# Patient Record
Sex: Female | Born: 1985
Health system: Southern US, Community
[De-identification: ages and names within clinical notes are randomized; demographics above are authoritative.]

## PROBLEM LIST (undated history)

## (undated) DIAGNOSIS — N840 Polyp of corpus uteri: Secondary | ICD-10-CM

## (undated) DIAGNOSIS — G43909 Migraine, unspecified, not intractable, without status migrainosus: Secondary | ICD-10-CM

## (undated) HISTORY — PX: WISDOM TOOTH EXTRACTION: SHX21

---

## 2017-12-01 ENCOUNTER — Other Ambulatory Visit (HOSPITAL_COMMUNITY)
Admission: RE | Admit: 2017-12-01 | Discharge: 2017-12-01 | Disposition: A | Discharge status: Home / Self Care | Payer: BLUE CROSS/BLUE SHIELD | Source: Ambulatory Visit | Attending: Obstetrics and Gynecology | Admitting: Obstetrics and Gynecology

## 2017-12-01 ENCOUNTER — Other Ambulatory Visit: Payer: Self-pay | Admitting: Obstetrics and Gynecology

## 2017-12-01 DIAGNOSIS — Z01411 Encounter for gynecological examination (general) (routine) with abnormal findings: Secondary | ICD-10-CM | POA: Diagnosis not present

## 2017-12-01 DIAGNOSIS — N760 Acute vaginitis: Secondary | ICD-10-CM | POA: Diagnosis not present

## 2017-12-01 DIAGNOSIS — N923 Ovulation bleeding: Secondary | ICD-10-CM | POA: Diagnosis not present

## 2017-12-05 LAB — CYTOLOGY - PAP
Diagnosis: NEGATIVE
HPV: NOT DETECTED

## 2017-12-06 DIAGNOSIS — Z13220 Encounter for screening for lipoid disorders: Secondary | ICD-10-CM | POA: Diagnosis not present

## 2017-12-06 DIAGNOSIS — Z131 Encounter for screening for diabetes mellitus: Secondary | ICD-10-CM | POA: Diagnosis not present

## 2017-12-27 DIAGNOSIS — R7309 Other abnormal glucose: Secondary | ICD-10-CM | POA: Diagnosis not present

## 2017-12-27 DIAGNOSIS — N923 Ovulation bleeding: Secondary | ICD-10-CM | POA: Diagnosis not present

## 2018-01-22 DIAGNOSIS — N923 Ovulation bleeding: Secondary | ICD-10-CM | POA: Diagnosis not present

## 2018-01-22 DIAGNOSIS — N9489 Other specified conditions associated with female genital organs and menstrual cycle: Secondary | ICD-10-CM | POA: Diagnosis not present

## 2018-02-28 ENCOUNTER — Ambulatory Visit (HOSPITAL_BASED_OUTPATIENT_CLINIC_OR_DEPARTMENT_OTHER)
Admission: RE | Admit: 2018-02-28 | Payer: BLUE CROSS/BLUE SHIELD | Source: Ambulatory Visit | Admitting: Obstetrics and Gynecology

## 2018-02-28 ENCOUNTER — Encounter (HOSPITAL_BASED_OUTPATIENT_CLINIC_OR_DEPARTMENT_OTHER): Admission: RE | Payer: Self-pay | Source: Ambulatory Visit

## 2018-02-28 SURGERY — DILATATION AND CURETTAGE /HYSTEROSCOPY
Anesthesia: Choice

## 2018-04-24 DIAGNOSIS — M62838 Other muscle spasm: Secondary | ICD-10-CM | POA: Diagnosis not present

## 2018-04-25 DIAGNOSIS — M542 Cervicalgia: Secondary | ICD-10-CM | POA: Diagnosis not present

## 2018-04-26 DIAGNOSIS — M542 Cervicalgia: Secondary | ICD-10-CM | POA: Diagnosis not present

## 2018-04-27 DIAGNOSIS — M542 Cervicalgia: Secondary | ICD-10-CM | POA: Diagnosis not present

## 2018-04-28 ENCOUNTER — Emergency Department (HOSPITAL_BASED_OUTPATIENT_CLINIC_OR_DEPARTMENT_OTHER)
Admission: EM | Admit: 2018-04-28 | Discharge: 2018-04-28 | Disposition: A | Discharge status: Home / Self Care | Payer: BLUE CROSS/BLUE SHIELD | Attending: Emergency Medicine | Admitting: Emergency Medicine

## 2018-04-28 ENCOUNTER — Other Ambulatory Visit: Payer: Self-pay

## 2018-04-28 ENCOUNTER — Encounter (HOSPITAL_BASED_OUTPATIENT_CLINIC_OR_DEPARTMENT_OTHER): Payer: Self-pay | Admitting: Emergency Medicine

## 2018-04-28 ENCOUNTER — Emergency Department (HOSPITAL_BASED_OUTPATIENT_CLINIC_OR_DEPARTMENT_OTHER): Payer: BLUE CROSS/BLUE SHIELD

## 2018-04-28 DIAGNOSIS — J029 Acute pharyngitis, unspecified: Secondary | ICD-10-CM | POA: Diagnosis not present

## 2018-04-28 DIAGNOSIS — M436 Torticollis: Secondary | ICD-10-CM | POA: Insufficient documentation

## 2018-04-28 DIAGNOSIS — M542 Cervicalgia: Secondary | ICD-10-CM | POA: Diagnosis not present

## 2018-04-28 LAB — CBC WITH DIFFERENTIAL/PLATELET
Basophils Absolute: 0 10*3/uL (ref 0.0–0.1)
Basophils Relative: 0 %
Eosinophils Absolute: 0.1 10*3/uL (ref 0.0–0.7)
Eosinophils Relative: 1 %
HCT: 37.9 % (ref 36.0–46.0)
Hemoglobin: 12.6 g/dL (ref 12.0–15.0)
Lymphocytes Relative: 30 %
Lymphs Abs: 2.2 10*3/uL (ref 0.7–4.0)
MCH: 29.6 pg (ref 26.0–34.0)
MCHC: 33.2 g/dL (ref 30.0–36.0)
MCV: 89 fL (ref 78.0–100.0)
Monocytes Absolute: 0.6 10*3/uL (ref 0.1–1.0)
Monocytes Relative: 8 %
Neutro Abs: 4.4 10*3/uL (ref 1.7–7.7)
Neutrophils Relative %: 61 %
Platelets: 226 10*3/uL (ref 150–400)
RBC: 4.26 MIL/uL (ref 3.87–5.11)
RDW: 12.3 % (ref 11.5–15.5)
WBC: 7.2 10*3/uL (ref 4.0–10.5)

## 2018-04-28 LAB — BASIC METABOLIC PANEL
Anion gap: 8 (ref 5–15)
BUN: 9 mg/dL (ref 6–20)
CO2: 25 mmol/L (ref 22–32)
Calcium: 9 mg/dL (ref 8.9–10.3)
Chloride: 104 mmol/L (ref 98–111)
Creatinine, Ser: 0.51 mg/dL (ref 0.44–1.00)
GFR calc Af Amer: >60 mL/min (ref >60)
GFR calc non Af Amer: >60 mL/min (ref >60)
Glucose, Bld: 88 mg/dL (ref 70–99)
Potassium: 4 mmol/L (ref 3.5–5.1)
Sodium: 137 mmol/L (ref 135–145)

## 2018-04-28 LAB — HCG, SERUM, QUALITATIVE: Preg, Serum: NEGATIVE

## 2018-04-28 MED ORDER — DIAZEPAM 5 MG PO TABS
5.0000 mg | ORAL_TABLET | Freq: Once | ORAL | Status: AC
  Administered 2018-04-28: 5 mg via ORAL
  Filled 2018-04-28: qty 1

## 2018-04-28 MED ORDER — KETOROLAC TROMETHAMINE 60 MG/2ML IM SOLN
60.0000 mg | Freq: Once | INTRAMUSCULAR | Status: AC
  Administered 2018-04-28: 60 mg via INTRAMUSCULAR
  Filled 2018-04-28: qty 2

## 2018-04-28 MED ORDER — IOPAMIDOL (ISOVUE-370) INJECTION 76%
100.0000 mL | Freq: Once | INTRAVENOUS | Status: AC | PRN
  Administered 2018-04-28: 100 mL via INTRAVENOUS

## 2018-04-28 MED ORDER — METHOCARBAMOL 500 MG PO TABS: 1000.0000 mg | ORAL_TABLET | Freq: Three times a day (TID) | ORAL | 0 refills | Status: AC | PRN

## 2018-04-28 MED ORDER — METHOCARBAMOL 500 MG PO TABS
1000.0000 mg | ORAL_TABLET | Freq: Once | ORAL | Status: AC
  Administered 2018-04-28: 1000 mg via ORAL
  Filled 2018-04-28: qty 2

## 2018-04-28 MED ORDER — DIAZEPAM 5 MG PO TABS: 5.0000 mg | ORAL_TABLET | Freq: Two times a day (BID) | ORAL | 0 refills | Status: DC | PRN

## 2018-04-28 MED ORDER — IBUPROFEN 600 MG PO TABS: 600.0000 mg | ORAL_TABLET | Freq: Four times a day (QID) | ORAL | 0 refills | Status: DC | PRN

## 2018-04-28 NOTE — ED Notes (Signed)
CT waiting for labs and IV prior to imaging with IV contrast

## 2018-04-28 NOTE — ED Provider Notes (Signed)
[facility] EMERGENCY DEPARTMENT Provider Note   CSN: [id number] Arrival date & time: 04/28/18  0803     History   Chief Complaint Chief Complaint  Patient presents with  . Neck Pain    HPI Michelle Sutton is a 32 y.o. female.  HPI Patient states she is been having neck pain for the past 5 days.  She is been seen by the chiropractor 3 times this week, last time was Thursday.  States that her symptoms were improving until she woke up this morning.  She complains of left-sided neck pain and difficulty turning her head.  She denies any chest pain.  No headache or visual changes.  No focal weakness or numbness.  Denies recent fever or chills.  She has had a scratchy, sore throat.  Has been taking naproxen at home with little relief. History reviewed. No pertinent past medical history.  There are no active problems to display for this patient.   Past Surgical History:  Procedure Laterality Date  . CESAREAN SECTION       OB History   None      Home Medications    Prior to Admission medications   Medication Sig Start Date End Date Taking? Authorizing Provider  diazepam (VALIUM) 5 MG tablet Take 1 tablet (5 mg total) by mouth every 12 (twelve) hours as needed for muscle spasms. 04/28/18   Loren RacerYelverton, Rossy Virag, MD  ibuprofen (ADVIL,MOTRIN) 600 MG tablet Take 1 tablet (600 mg total) by mouth every 6 (six) hours as needed. 04/28/18   Loren RacerYelverton, Kaely Hollan, MD  methocarbamol (ROBAXIN) 500 MG tablet Take 2 tablets (1,000 mg total) by mouth every 8 (eight) hours as needed for muscle spasms. 04/28/18   Loren RacerYelverton, Phelix Fudala, MD    Family History No family history on file.  Social History Social History   Tobacco Use  . Smoking status: Never Smoker  . Smokeless tobacco: Never Used  Substance Use Topics  . Alcohol use: Yes  . Drug use: Never     Allergies   Sulfa antibiotics   Review of Systems Review of Systems  Constitutional: Negative for chills and fever.  Eyes: Negative  for visual disturbance.  Respiratory: Negative for cough and shortness of breath.   Cardiovascular: Negative for chest pain.  Gastrointestinal: Negative for abdominal pain, diarrhea, nausea and vomiting.  Musculoskeletal: Positive for myalgias, neck pain and neck stiffness. Negative for back pain.  Skin: Negative for rash and wound.  Neurological: Negative for dizziness, weakness, light-headedness, numbness and headaches.  All other systems reviewed and are negative.    Physical Exam Updated Vital Signs BP 114/68 (BP Location: Right Arm)   Pulse 68   Temp 98.2 F (36.8 C) (Oral)   Resp 18   Ht 5' 2.5" (1.588 m)   Wt 59 kg   LMP 04/10/2018   SpO2 100%   BMI 23.40 kg/m   Physical Exam  Constitutional: She is oriented to person, place, and time. She appears well-developed and well-nourished. No distress.  HENT:  Head: Normocephalic and atraumatic.  Oropharynx is mildly erythematous.  Uvula is midline.  No tonsillar exudates.  Eyes: Pupils are equal, round, and reactive to light. EOM are normal.  Neck:  Tense musculature to the left lateral side of the neck and left trapezius.  No definite midline posterior cervical tenderness to palpation.  Carotid pulses intact.  No bruits appreciated.  Cardiovascular: Normal rate and regular rhythm. Exam reveals no gallop and no friction rub.  No murmur heard. Pulmonary/Chest:  Effort normal and breath sounds normal. No stridor. No respiratory distress. She has no wheezes. She has no rales. She exhibits no tenderness.  Abdominal: Soft. Bowel sounds are normal. There is no tenderness. There is no rebound and no guarding.  Musculoskeletal: Normal range of motion. She exhibits no edema or tenderness.  No midline thoracic or lumbar tenderness.  No lower extremity swelling, asymmetry or tenderness.  Distal pulses are 2+  Lymphadenopathy:    She has no cervical adenopathy.  Neurological: She is alert and oriented to person, place, and time.  Moving  all extremities without focal deficit.  Sensation intact.  Skin: Skin is warm and dry. No rash noted. She is not diaphoretic. No erythema.  Psychiatric: She has a normal mood and affect. Her behavior is normal.  Nursing note and vitals reviewed.    ED Treatments / Results  Labs (all labs ordered are listed, but only abnormal results are displayed) Labs Reviewed  BASIC METABOLIC PANEL  CBC WITH DIFFERENTIAL/PLATELET  HCG, SERUM, QUALITATIVE    EKG None  Radiology Dg Cervical Spine Complete  Result Date: 04/28/2018 CLINICAL DATA:  Neck pain 6 days.  No injury. EXAM: CERVICAL SPINE - COMPLETE 4+ VIEW COMPARISON:  None. FINDINGS: Vertebral body alignment, heights and disc space heights are normal. Prevertebral soft tissues are normal. The atlantoaxial articulation is normal. No evidence of right-sided neural foraminal narrowing. Mild left-sided neural foraminal narrowing at the C4-5 level due to adjacent bony spurring. IMPRESSION: No acute findings. Minimal left-sided neural foraminal narrowing at the C4-5 level due to adjacent bony spurring. Electronically Signed   By: Elberta Fortisaniel  Boyle M.D.   On: 04/28/2018 09:33   Ct Angio Neck W And/or Wo Contrast  Result Date: 04/28/2018 CLINICAL DATA:  Neck pain. Chiropractor manipulation this week. EXAM: CT ANGIOGRAPHY NECK TECHNIQUE: Multidetector CT imaging of the neck was performed using the standard protocol during bolus administration of intravenous contrast. Multiplanar CT image reconstructions and MIPs were obtained to evaluate the vascular anatomy. Carotid stenosis measurements (when applicable) are obtained utilizing NASCET criteria, using the distal internal carotid diameter as the denominator. CONTRAST:  100mL ISOVUE-370 IOPAMIDOL (ISOVUE-370) INJECTION 76% COMPARISON:  Cervical radiographs 04/28/2018 FINDINGS: Aortic arch: Standard branching. Imaged portion shows no evidence of aneurysm or dissection. No significant stenosis of the major arch  vessel origins. Right carotid system: Normal right carotid system Left carotid system: Normal left carotid system Vertebral arteries: Normal vertebral arteries bilaterally. Skeleton: Normal cervical spine.  No skeletal abnormality. Other neck: No soft tissue swelling or mass. Upper chest: Normal lung apices. IMPRESSION: Normal CTA neck. Negative for dissection or stenosis. No bony abnormality. Electronically Signed   By: Marlan Palauharles  Clark M.D.   On: 04/28/2018 12:40    Procedures Procedures (including critical care time)  Medications Ordered in ED Medications  ketorolac (TORADOL) injection 60 mg (60 mg Intramuscular Given 04/28/18 0901)  methocarbamol (ROBAXIN) tablet 1,000 mg (1,000 mg Oral Given 04/28/18 0900)  diazepam (VALIUM) tablet 5 mg (5 mg Oral Given 04/28/18 0900)  iopamidol (ISOVUE-370) 76 % injection 100 mL (100 mLs Intravenous Contrast Given 04/28/18 1219)     Initial Impression / Assessment and Plan / ED Course  I have reviewed the triage vital signs and the nursing notes.  Pertinent labs & imaging results that were available during my care of the patient were reviewed by me and considered in my medical decision making (see chart for details).     Improved movement and pain with medication.  Given history of  multiple adjustments CT angio neck obtained.  No evidence of vascular injury.  Laboratory work-up is normal.  Normal white blood cell count.  Low suspicion for infectious cause.  Will treat symptomatically for torticollis.  Have advised using heating pad.  Return precautions given.  Final Clinical Impressions(s) / ED Diagnoses   Final diagnoses:  Torticollis, acute    ED Discharge Orders         Ordered    diazepam (VALIUM) 5 MG tablet  Every 12 hours PRN     04/28/18 1307    ibuprofen (ADVIL,MOTRIN) 600 MG tablet  Every 6 hours PRN     04/28/18 1307    methocarbamol (ROBAXIN) 500 MG tablet  Every 8 hours PRN     04/28/18 1307           Loren Racer,  MD 04/28/18 1310

## 2018-04-28 NOTE — ED Notes (Signed)
Patient transported to X-ray 

## 2018-04-28 NOTE — ED Triage Notes (Signed)
Neck pain since Monday. Denies injury. Was seen by chiropractor several times this week. States it was getting better but the pain worsened again yesterday.

## 2018-05-08 DIAGNOSIS — D259 Leiomyoma of uterus, unspecified: Secondary | ICD-10-CM | POA: Diagnosis not present

## 2018-05-08 DIAGNOSIS — N921 Excessive and frequent menstruation with irregular cycle: Secondary | ICD-10-CM | POA: Diagnosis not present

## 2018-05-21 ENCOUNTER — Other Ambulatory Visit: Payer: Self-pay | Admitting: Obstetrics & Gynecology

## 2018-05-23 ENCOUNTER — Other Ambulatory Visit: Payer: Self-pay

## 2018-05-23 ENCOUNTER — Encounter (HOSPITAL_BASED_OUTPATIENT_CLINIC_OR_DEPARTMENT_OTHER): Payer: Self-pay

## 2018-05-23 NOTE — Progress Notes (Signed)
Spoke with:  Michelle Sutton NPO:   No food after midnight/Clear liquids until 7:00AM DOS Arrival time: 1145AM Labs: UPT AM medications: None Pre op orders: Yes Ride home:  Barkley Boards (husband) [phone]

## 2018-05-30 NOTE — H&P (Signed)
Michelle Sutton is an 32 y.o. female with intermenstrual bleeding and dysmenorrhea.  She was found to have a 1.5 cm submucosal endometrial lesion, likely a fibroid and is here for D & C hysteroscopy with Myosure.    Pertinent Gynecological History: Menses: regular every 28 days without intermenstrual spotting and with severe dysmenorrhea Bleeding: intermenstrual bleeding Contraception: none DES exposure: unknown Blood transfusions: none Sexually transmitted diseases: no past history Previous GYN Procedures: None  Last mammogram: None Date: N/A Last pap: normal Date: 12/01/17 OB History: G1, P1001   Menstrual History: Patient's last menstrual period was 05/06/2018 (exact date).    Past Medical History:  Diagnosis Date  . Migraines     Past Surgical History:  Procedure Laterality Date  . CESAREAN SECTION    . WISDOM TOOTH EXTRACTION      History reviewed. No pertinent family history.  Social History:  reports that she has never smoked. She has never used smokeless tobacco. She reports that she drinks alcohol. She reports that she does not use drugs.  Allergies:  Allergies  Allergen Reactions  . Sulfa Antibiotics   . Eggs Or Egg-Derived Products Rash    No medications prior to admission.    ROS Constitutional: Denies fevers/chills Cardiovascular: Denies chest pain or palpitations Pulmonary: Denies coughing or wheezing Gastrointestinal: Denies nausea, vomiting or diarrhea Genitourinary: Denies unusual vaginal discharge, dysuria, urgency or frequency.  Musculoskeletal: Denies muscle or joint aches and pain.  Neurology: Denies abnormal sensations such as tingling or numbness.   Height 5\' 3"  (1.6 m), weight 59 kg, last menstrual period 05/06/2018. Physical Exam Constitutional: She is oriented to person, place, and time. She appears well-developed and well-nourished.  HENT:  Head: Normocephalic and atraumatic.  Neck: Normal range of motion.  Cardiovascular: Normal rate,  regular rhythm and normal heart sounds.   Respiratory: Effort normal and breath sounds normal.  GI: Soft. Bowel sounds are normal.  Neurological: She is alert and oriented to person, place, and time.  Skin: Skin is warm and dry.  Psychiatric: She has a normal mood and affect. Her behavior is normal.  No results found for this or any previous visit (from the past 24 hour(s)).  No results found. CBC    Component Value Date/Time   WBC 7.2 04/28/2018 1129   RBC 4.26 04/28/2018 1129   HGB 12.6 04/28/2018 1129   HCT 37.9 04/28/2018 1129   PLT 226 04/28/2018 1129   MCV 89.0 04/28/2018 1129   MCH 29.6 04/28/2018 1129   MCHC 33.2 04/28/2018 1129   RDW 12.3 04/28/2018 1129   LYMPHSABS 2.2 04/28/2018 1129   MONOABS 0.6 04/28/2018 1129   EOSABS 0.1 04/28/2018 1129   BASOSABS 0.0 04/28/2018 1129    05/31/18: Pregnancy, urine POC  Order: [id number]  Status:  Final result Visible to patient:  No (Not Released) Next appt:  None   Ref Range & Units 12:20  Preg Test, Ur NEGATIVE NEGATIVE   Comment:     THE SENSITIVITY OF THIS  METHODOLOGY IS >24 mIU/mL        Pelvic Ultrasound 01/22/18: Uterus 9 x 6x 4cm with a fundal mass 1.4 cm x 1.3cm x 1cm, confirmed on saline sonohysterogram.  Normal ovaries bilaterally.    Assessment/Plan: 32 y/o P1 with metrorrhagia and dysmenorrhea with a 1.5 cm intaruterine lesion likely a myoma, here for a  D & C hysteroscopy with myosure -This procedure has been fully reviewed with the patient and written informed consent has been obtained. -Admit to day  surgery -NPO -IV fluids Michelle Sutton,Michelle Lorenzi WAKURU, MD.  05/30/2018, 3:22 PM

## 2018-05-31 ENCOUNTER — Ambulatory Visit (HOSPITAL_BASED_OUTPATIENT_CLINIC_OR_DEPARTMENT_OTHER): Payer: BLUE CROSS/BLUE SHIELD | Admitting: Anesthesiology

## 2018-05-31 ENCOUNTER — Other Ambulatory Visit: Payer: Self-pay

## 2018-05-31 ENCOUNTER — Encounter (HOSPITAL_BASED_OUTPATIENT_CLINIC_OR_DEPARTMENT_OTHER)
Admission: RE | Disposition: A | Discharge status: Home / Self Care | Payer: Self-pay | Source: Ambulatory Visit | Attending: Obstetrics & Gynecology

## 2018-05-31 ENCOUNTER — Ambulatory Visit (HOSPITAL_BASED_OUTPATIENT_CLINIC_OR_DEPARTMENT_OTHER)
Admission: RE | Admit: 2018-05-31 | Discharge: 2018-05-31 | Disposition: A | Discharge status: Home / Self Care | Payer: BLUE CROSS/BLUE SHIELD | Source: Ambulatory Visit | Attending: Obstetrics & Gynecology | Admitting: Obstetrics & Gynecology

## 2018-05-31 ENCOUNTER — Encounter (HOSPITAL_BASED_OUTPATIENT_CLINIC_OR_DEPARTMENT_OTHER): Payer: Self-pay | Admitting: Anesthesiology

## 2018-05-31 DIAGNOSIS — Z91012 Allergy to eggs: Secondary | ICD-10-CM | POA: Diagnosis not present

## 2018-05-31 DIAGNOSIS — Z882 Allergy status to sulfonamides status: Secondary | ICD-10-CM | POA: Insufficient documentation

## 2018-05-31 DIAGNOSIS — N841 Polyp of cervix uteri: Secondary | ICD-10-CM | POA: Diagnosis not present

## 2018-05-31 DIAGNOSIS — N946 Dysmenorrhea, unspecified: Secondary | ICD-10-CM | POA: Insufficient documentation

## 2018-05-31 DIAGNOSIS — N921 Excessive and frequent menstruation with irregular cycle: Secondary | ICD-10-CM | POA: Diagnosis not present

## 2018-05-31 DIAGNOSIS — D259 Leiomyoma of uterus, unspecified: Secondary | ICD-10-CM | POA: Diagnosis not present

## 2018-05-31 HISTORY — PX: DILATATION & CURETTAGE/HYSTEROSCOPY WITH MYOSURE: SHX6511

## 2018-05-31 HISTORY — DX: Migraine, unspecified, not intractable, without status migrainosus: G43.909

## 2018-05-31 LAB — POCT PREGNANCY, URINE: Preg Test, Ur: NEGATIVE

## 2018-05-31 SURGERY — DILATATION & CURETTAGE/HYSTEROSCOPY WITH MYOSURE
Anesthesia: Monitor Anesthesia Care | Site: Vagina

## 2018-05-31 MED ORDER — ONDANSETRON HCL 4 MG/2ML IJ SOLN
INTRAMUSCULAR | Status: DC | PRN
  Administered 2018-05-31: 4 mg via INTRAVENOUS

## 2018-05-31 MED ORDER — SODIUM CHLORIDE 0.9 % IR SOLN
Status: DC | PRN
  Administered 2018-05-31: 3000 mL

## 2018-05-31 MED ORDER — METOCLOPRAMIDE HCL 5 MG/ML IJ SOLN
10.0000 mg | Freq: Once | INTRAMUSCULAR | Status: DC | PRN
  Filled 2018-05-31: qty 2

## 2018-05-31 MED ORDER — FENTANYL CITRATE (PF) 100 MCG/2ML IJ SOLN
INTRAMUSCULAR | Status: DC | PRN
  Administered 2018-05-31 (×2): 25 ug via INTRAVENOUS

## 2018-05-31 MED ORDER — KETOROLAC TROMETHAMINE 30 MG/ML IJ SOLN
INTRAMUSCULAR | Status: AC
  Filled 2018-05-31: qty 1

## 2018-05-31 MED ORDER — PROPOFOL 10 MG/ML IV BOLUS
INTRAVENOUS | Status: AC
  Filled 2018-05-31: qty 40

## 2018-05-31 MED ORDER — LIDOCAINE 2% (20 MG/ML) 5 ML SYRINGE
INTRAMUSCULAR | Status: AC
  Filled 2018-05-31: qty 5

## 2018-05-31 MED ORDER — SCOPOLAMINE 1 MG/3DAYS TD PT72
MEDICATED_PATCH | TRANSDERMAL | Status: AC
  Filled 2018-05-31: qty 1

## 2018-05-31 MED ORDER — SILVER NITRATE-POT NITRATE 75-25 % EX MISC
CUTANEOUS | Status: DC | PRN
  Administered 2018-05-31: 1

## 2018-05-31 MED ORDER — FENTANYL CITRATE (PF) 100 MCG/2ML IJ SOLN
INTRAMUSCULAR | Status: AC
  Filled 2018-05-31: qty 2

## 2018-05-31 MED ORDER — MEPERIDINE HCL 25 MG/ML IJ SOLN
6.2500 mg | INTRAMUSCULAR | Status: DC | PRN
  Filled 2018-05-31: qty 1

## 2018-05-31 MED ORDER — HYDROMORPHONE HCL 1 MG/ML IJ SOLN
0.2500 mg | INTRAMUSCULAR | Status: DC | PRN
  Filled 2018-05-31: qty 0.5

## 2018-05-31 MED ORDER — SCOPOLAMINE 1 MG/3DAYS TD PT72
1.0000 | MEDICATED_PATCH | TRANSDERMAL | Status: DC
  Administered 2018-05-31: 1.5 mg via TRANSDERMAL
  Filled 2018-05-31: qty 1

## 2018-05-31 MED ORDER — MIDAZOLAM HCL 2 MG/2ML IJ SOLN
INTRAMUSCULAR | Status: AC
  Filled 2018-05-31: qty 2

## 2018-05-31 MED ORDER — HYDROCODONE-ACETAMINOPHEN 7.5-325 MG PO TABS
1.0000 | ORAL_TABLET | Freq: Once | ORAL | Status: DC | PRN
  Filled 2018-05-31: qty 1

## 2018-05-31 MED ORDER — PROPOFOL 500 MG/50ML IV EMUL
INTRAVENOUS | Status: DC | PRN
  Administered 2018-05-31: 200 ug/kg/min via INTRAVENOUS

## 2018-05-31 MED ORDER — DEXAMETHASONE SODIUM PHOSPHATE 10 MG/ML IJ SOLN
INTRAMUSCULAR | Status: AC
  Filled 2018-05-31: qty 1

## 2018-05-31 MED ORDER — IBUPROFEN 600 MG PO TABS: 600.0000 mg | ORAL_TABLET | Freq: Four times a day (QID) | ORAL | 0 refills | Status: AC | PRN

## 2018-05-31 MED ORDER — BUPIVACAINE-EPINEPHRINE 0.5% -1:200000 IJ SOLN
INTRAMUSCULAR | Status: DC | PRN
  Administered 2018-05-31: 10 mL

## 2018-05-31 MED ORDER — KETOROLAC TROMETHAMINE 30 MG/ML IJ SOLN
INTRAMUSCULAR | Status: DC | PRN
  Administered 2018-05-31: 30 mg via INTRAVENOUS

## 2018-05-31 MED ORDER — LIDOCAINE HCL (CARDIAC) PF 100 MG/5ML IV SOSY
PREFILLED_SYRINGE | INTRAVENOUS | Status: DC | PRN
  Administered 2018-05-31: 50 mg via INTRAVENOUS

## 2018-05-31 MED ORDER — LACTATED RINGERS IV SOLN
INTRAVENOUS | Status: DC
  Administered 2018-05-31 (×2): via INTRAVENOUS
  Filled 2018-05-31: qty 1000

## 2018-05-31 MED ORDER — MIDAZOLAM HCL 5 MG/5ML IJ SOLN
INTRAMUSCULAR | Status: DC | PRN
  Administered 2018-05-31 (×2): 1 mg via INTRAVENOUS

## 2018-05-31 MED ORDER — PROPOFOL 10 MG/ML IV BOLUS
INTRAVENOUS | Status: AC
  Filled 2018-05-31: qty 20

## 2018-05-31 MED ORDER — ONDANSETRON HCL 4 MG/2ML IJ SOLN
INTRAMUSCULAR | Status: AC
  Filled 2018-05-31: qty 2

## 2018-05-31 MED ORDER — DEXAMETHASONE SODIUM PHOSPHATE 4 MG/ML IJ SOLN
INTRAMUSCULAR | Status: DC | PRN
  Administered 2018-05-31: 10 mg via INTRAVENOUS

## 2018-05-31 SURGICAL SUPPLY — 17 items
CANISTER SUCT 3000ML PPV (MISCELLANEOUS) ×2 IMPLANT
CATH ROBINSON RED A/P 16FR (CATHETERS) ×2 IMPLANT
DEVICE MYOSURE LITE (MISCELLANEOUS) ×2 IMPLANT
DEVICE MYOSURE REACH (MISCELLANEOUS) IMPLANT
GAUZE SPONGE 4X4 16PLY XRAY LF (GAUZE/BANDAGES/DRESSINGS) ×2 IMPLANT
GLOVE BIO SURGEON STRL SZ7 (GLOVE) ×2 IMPLANT
GLOVE BIOGEL PI IND STRL 7.0 (GLOVE) ×3 IMPLANT
GLOVE BIOGEL PI IND STRL 7.5 (GLOVE) ×3 IMPLANT
GLOVE BIOGEL PI INDICATOR 7.0 (GLOVE) ×3
GLOVE BIOGEL PI INDICATOR 7.5 (GLOVE) ×3
GLOVE SURG SS PI 6.5 STRL IVOR (GLOVE) ×2 IMPLANT
GOWN STRL REUS W/TWL LRG LVL3 (GOWN DISPOSABLE) ×6 IMPLANT
KIT PROCEDURE FLUENT (KITS) ×2 IMPLANT
PACK VAGINAL MINOR WOMEN LF (CUSTOM PROCEDURE TRAY) ×2 IMPLANT
PAD OB MATERNITY 4.3X12.25 (PERSONAL CARE ITEMS) ×2 IMPLANT
SEAL ROD LENS SCOPE MYOSURE (ABLATOR) ×2 IMPLANT
TOWEL OR 17X24 6PK STRL BLUE (TOWEL DISPOSABLE) ×4 IMPLANT

## 2018-05-31 NOTE — Op Note (Signed)
Michelle Sutton, [place] Female, [place] y.o., 1986/04/20 MRN: [id number]  PREOP DIAGNOSIS:  1.Metrorrhagia 2.Dymenorrhea 3.Endometrial lesion, suspect fibroid.    Post operative diagnosis: Same as above  Procedures: Dilation and Currettage hysteroscopy, Myosure.   Surgeon: Dr. Waymon Amato  Assistant: None  Anesthesia: MAC  Complications: None  IV fluid: 600 cc normal saline  Fluid deficit: 725 cc  Urine: 50 cc straight catheterization  EBL: 10 cc  Indications: 32 year old P1 with a history of metrorrhagia and dysmenorrhea and found to have an endometrial lesion, suspect a fibroid here for resection.        Procedure:  Informed consent was obtained from the patient to undergo the procedure including risks of bleeding, infection and  damage to organs.  She was taken to the operating room and anesthesia was administered without difficulty.  She was prepped and draped in the usual sterile fashion. She was straight catheterized.  An exam was then performed under anesthesia revealing a small anteverted uterus and a closed cervix. There were no palpable adnexal masses.A small cut at perineum was noted before the procedure was began.    A graves speculum was used to view the cervix. Single-tooth tenaculum was placed anteriorly then posteriorly on the cervix. The cervix was dilated to #19 pratt dilators but hysteroscope could not advance through the internal cervical os.  Further cervical dilation with Hegar dilators was performed up to number 7 dilator.  Myosure diagnostic scope was then placed to view the uterine cavity and uterus was noted to have thick fluffy endometrial tissue as well as small fibroid-like lesion on posterior uterine wall just infront of the right tubal ostia.  The thick endometrial tissue as well as the lesion were shaved off using Myosure LITE under direct visualization without any complications. She tolerated the procedure well. Both tubal ostia were visualized during the procedure.  No  other lesions were noted. The myosure was removed and sharp curretage performed to obtain endocervical and endometrial specimen  All instruments were then removed and the patient was awoken from anesthesia and taken to recovery room in stable condition  Specimen: Endometrial lesion suspect fibroids and shavings, endocervical currettings.   Disposition: Stable to PACU.   Dr. Waymon Amato.  05/31/2018 1501.

## 2018-05-31 NOTE — Discharge Instructions (Signed)
°  Post D & C Hysteroscopy Instructions Ms. Michelle Sutton, 1. Nothing in vagina x 2 weeks.  2. Expect some vaginal bleeding for next several days, call me if with excessive bleeding requiring you to change a pad every hour.  3.  Expect some abdominal cramping, take pain medication as needed.  Call me if pain is intolerable despite medication use.  Sincerely, Dr. Sallye OberKulwa.   Post Anesthesia Home Care Instructions  Activity: Get plenty of rest for the remainder of the day. A responsible adult should stay with you for 24 hours following the procedure.  For the next 24 hours, DO NOT: -Drive a car -Advertising copywriterperate machinery -Drink alcoholic beverages -Take any medication unless instructed by your physician -Make any legal decisions or sign important papers.  Meals: Start with liquid foods such as gelatin or soup. Progress to regular foods as tolerated. Avoid greasy, spicy, heavy foods. If nausea and/or vomiting occur, drink only clear liquids until the nausea and/or vomiting subsides. Call your physician if vomiting continues.  Special Instructions/Symptoms: Your throat may feel dry or sore from the anesthesia or the breathing tube placed in your throat during surgery. If this causes discomfort, gargle with warm salt water. The discomfort should disappear within 24 hours.  If you had a scopolamine patch placed behind your ear for the management of post- operative nausea and/or vomiting:  1. The medication in the patch is effective for 72 hours, after which it should be removed.  Wrap patch in a tissue and discard in the trash. Wash hands thoroughly with soap and water. 2. You may remove the patch earlier than 72 hours if you experience unpleasant side effects which may include dry mouth, dizziness or visual disturbances. 3. Avoid touching the patch. Wash your hands with soap and water after contact with the patch.

## 2018-05-31 NOTE — Interval H&P Note (Signed)
Patient is consented for a Dilation and currettage Hysteroscopy with Myosure after reviewing the risks, benefits and alternatives of the procedure.   Dr. Sallye OberKulwa.  05/31/2018 1351

## 2018-05-31 NOTE — Anesthesia Procedure Notes (Signed)
Procedure Name: MAC Date/Time: 05/31/2018 1:50 PM Performed by: Wanita Chamberlain, CRNA Pre-anesthesia Checklist: Patient being monitored, Suction available, Emergency Drugs available, Patient identified and Timeout performed Patient Re-evaluated:Patient Re-evaluated prior to induction Oxygen Delivery Method: Nasal cannula Induction Type: IV induction Placement Confirmation: CO2 detector,  positive ETCO2 and breath sounds checked- equal and bilateral Dental Injury: Teeth and Oropharynx as per pre-operative assessment

## 2018-05-31 NOTE — Anesthesia Preprocedure Evaluation (Addendum)
Anesthesia Evaluation  Patient identified by MRN, date of birth, ID band Patient awake    Reviewed: Allergy & Precautions, NPO status , Patient's Chart, lab work & pertinent test results  Airway Mallampati: II  TM Distance: >3 FB Neck ROM: Full    Dental no notable dental hx. (+) Teeth Intact, Dental Advisory Given   Pulmonary neg pulmonary ROS,    Pulmonary exam normal breath sounds clear to auscultation       Cardiovascular negative cardio ROS Normal cardiovascular exam Rhythm:Regular Rate:Normal     Neuro/Psych  Headaches, negative psych ROS   GI/Hepatic negative GI ROS, Neg liver ROS,   Endo/Other  negative endocrine ROS  Renal/GU negative Renal ROS  negative genitourinary   Musculoskeletal negative musculoskeletal ROS (+)   Abdominal (+) - obese,   Peds  Hematology negative hematology ROS (+)   Anesthesia Other Findings   Reproductive/Obstetrics Uterine fibroids Irregular menstrual bleeding                            Anesthesia Physical Anesthesia Plan  ASA: II  Anesthesia Plan: MAC   Post-op Pain Management:    Induction: Intravenous  PONV Risk Score and Plan: 4 or greater and Scopolamine patch - Pre-op, Midazolam, Dexamethasone, Ondansetron and Treatment may vary due to age or medical condition  Airway Management Planned: Natural Airway and Simple Face Mask  Additional Equipment:   Intra-op Plan:   Post-operative Plan:   Informed Consent: I have reviewed the patients History and Physical, chart, labs and discussed the procedure including the risks, benefits and alternatives for the proposed anesthesia with the patient or authorized representative who has indicated his/her understanding and acceptance.   Dental advisory given  Plan Discussed with: CRNA and Surgeon  Anesthesia Plan Comments:        Anesthesia Quick Evaluation

## 2018-05-31 NOTE — Transfer of Care (Signed)
Immediate Anesthesia Transfer of Care Note  Patient: [facility]  Procedure(s) Performed: DILATATION & CURETTAGE/HYSTEROSCOPY WITH MYOSURE (N/A Vagina )  Patient Location: PACU  Anesthesia Type:MAC  Level of Consciousness: awake, alert , oriented and patient cooperative  Airway & Oxygen Therapy: Patient Spontanous Breathing and Patient connected to nasal cannula oxygen  Post-op Assessment: Report given to RN and Post -op Vital signs reviewed and stable  Post vital signs: Reviewed and stable  Last Vitals:  Vitals Value Taken Time  BP 116/69 05/31/2018  2:45 PM  Temp    Pulse 63 05/31/2018  2:46 PM  Resp 16 05/31/2018  2:46 PM  SpO2 100 % 05/31/2018  2:46 PM  Vitals shown include unvalidated device data.  Last Pain:  Vitals:   05/31/18 1231  TempSrc:   PainSc: 0-No pain      Patients Stated Pain Goal: 6 (12/75/17 0017)  Complications: No apparent anesthesia complications

## 2018-06-01 ENCOUNTER — Encounter (HOSPITAL_BASED_OUTPATIENT_CLINIC_OR_DEPARTMENT_OTHER): Payer: Self-pay | Admitting: Obstetrics & Gynecology

## 2018-06-04 NOTE — Anesthesia Postprocedure Evaluation (Addendum)
Anesthesia Post Note  Patient: Michelle Sutton  Procedure(s) Performed: DILATATION & CURETTAGE/HYSTEROSCOPY WITH MYOSURE (N/A Vagina )     Patient location during evaluation: PACU Anesthesia Type: MAC Level of consciousness: awake and alert Pain management: pain level controlled Vital Signs Assessment: post-procedure vital signs reviewed and stable Respiratory status: spontaneous breathing, nonlabored ventilation, respiratory function stable and patient connected to nasal cannula oxygen Cardiovascular status: blood pressure returned to baseline and stable Postop Assessment: no apparent nausea or vomiting Anesthetic complications: no    Last Vitals:  Vitals:   05/31/18 1545 05/31/18 1636  BP:  112/71  Pulse: 76 64  Resp:  18  Temp:  37.1 C  SpO2: 100%     Last Pain:  Vitals:   06/01/18 1325  TempSrc:   PainSc: 0-No pain   Pain Goal: Patients Stated Pain Goal: 6 (05/31/18 1231)               Lidia Collum

## 2018-06-14 DIAGNOSIS — N898 Other specified noninflammatory disorders of vagina: Secondary | ICD-10-CM | POA: Diagnosis not present

## 2018-06-28 DIAGNOSIS — Z0000 Encounter for general adult medical examination without abnormal findings: Secondary | ICD-10-CM | POA: Diagnosis not present

## 2018-07-10 DIAGNOSIS — M545 Low back pain: Secondary | ICD-10-CM | POA: Diagnosis not present

## 2018-10-04 DIAGNOSIS — R1013 Epigastric pain: Secondary | ICD-10-CM | POA: Diagnosis not present

## 2019-02-05 DIAGNOSIS — Z6824 Body mass index (BMI) 24.0-24.9, adult: Secondary | ICD-10-CM | POA: Diagnosis not present

## 2019-02-05 DIAGNOSIS — Z01419 Encounter for gynecological examination (general) (routine) without abnormal findings: Secondary | ICD-10-CM | POA: Diagnosis not present

## 2019-02-25 DIAGNOSIS — M5431 Sciatica, right side: Secondary | ICD-10-CM | POA: Diagnosis not present

## 2019-02-25 DIAGNOSIS — M545 Low back pain: Secondary | ICD-10-CM | POA: Diagnosis not present

## 2019-02-26 DIAGNOSIS — M542 Cervicalgia: Secondary | ICD-10-CM | POA: Diagnosis not present

## 2019-02-26 DIAGNOSIS — M546 Pain in thoracic spine: Secondary | ICD-10-CM | POA: Diagnosis not present

## 2019-02-26 DIAGNOSIS — M545 Low back pain: Secondary | ICD-10-CM | POA: Diagnosis not present

## 2019-02-28 DIAGNOSIS — M545 Low back pain: Secondary | ICD-10-CM | POA: Diagnosis not present

## 2019-03-05 DIAGNOSIS — M545 Low back pain: Secondary | ICD-10-CM | POA: Diagnosis not present

## 2019-03-07 DIAGNOSIS — M545 Low back pain: Secondary | ICD-10-CM | POA: Diagnosis not present

## 2019-03-19 DIAGNOSIS — M545 Low back pain: Secondary | ICD-10-CM | POA: Diagnosis not present

## 2019-03-19 DIAGNOSIS — M542 Cervicalgia: Secondary | ICD-10-CM | POA: Diagnosis not present

## 2019-03-26 DIAGNOSIS — M546 Pain in thoracic spine: Secondary | ICD-10-CM | POA: Diagnosis not present

## 2019-03-26 DIAGNOSIS — M545 Low back pain: Secondary | ICD-10-CM | POA: Diagnosis not present

## 2019-03-28 DIAGNOSIS — M542 Cervicalgia: Secondary | ICD-10-CM | POA: Diagnosis not present

## 2019-03-28 DIAGNOSIS — M545 Low back pain: Secondary | ICD-10-CM | POA: Diagnosis not present

## 2019-04-02 DIAGNOSIS — M546 Pain in thoracic spine: Secondary | ICD-10-CM | POA: Diagnosis not present

## 2019-04-02 DIAGNOSIS — M542 Cervicalgia: Secondary | ICD-10-CM | POA: Diagnosis not present

## 2019-04-16 DIAGNOSIS — M542 Cervicalgia: Secondary | ICD-10-CM | POA: Diagnosis not present

## 2019-04-16 DIAGNOSIS — M545 Low back pain: Secondary | ICD-10-CM | POA: Diagnosis not present

## 2019-05-07 DIAGNOSIS — M545 Low back pain: Secondary | ICD-10-CM | POA: Diagnosis not present

## 2019-05-07 DIAGNOSIS — M62838 Other muscle spasm: Secondary | ICD-10-CM | POA: Diagnosis not present

## 2019-05-29 DIAGNOSIS — R109 Unspecified abdominal pain: Secondary | ICD-10-CM | POA: Diagnosis not present

## 2019-05-29 DIAGNOSIS — N920 Excessive and frequent menstruation with regular cycle: Secondary | ICD-10-CM | POA: Diagnosis not present

## 2019-05-29 DIAGNOSIS — R102 Pelvic and perineal pain: Secondary | ICD-10-CM | POA: Diagnosis not present

## 2019-05-29 DIAGNOSIS — Z113 Encounter for screening for infections with a predominantly sexual mode of transmission: Secondary | ICD-10-CM | POA: Diagnosis not present

## 2019-06-06 DIAGNOSIS — M542 Cervicalgia: Secondary | ICD-10-CM | POA: Diagnosis not present

## 2019-06-06 DIAGNOSIS — M546 Pain in thoracic spine: Secondary | ICD-10-CM | POA: Diagnosis not present

## 2019-06-25 DIAGNOSIS — Z1159 Encounter for screening for other viral diseases: Secondary | ICD-10-CM | POA: Diagnosis not present

## 2019-07-01 DIAGNOSIS — B349 Viral infection, unspecified: Secondary | ICD-10-CM | POA: Diagnosis not present

## 2019-07-01 DIAGNOSIS — R05 Cough: Secondary | ICD-10-CM | POA: Diagnosis not present

## 2019-07-01 DIAGNOSIS — J3489 Other specified disorders of nose and nasal sinuses: Secondary | ICD-10-CM | POA: Diagnosis not present

## 2019-07-02 DIAGNOSIS — R05 Cough: Secondary | ICD-10-CM | POA: Diagnosis not present

## 2019-07-11 DIAGNOSIS — M545 Low back pain: Secondary | ICD-10-CM | POA: Diagnosis not present

## 2019-08-01 DIAGNOSIS — M545 Low back pain: Secondary | ICD-10-CM | POA: Diagnosis not present

## 2019-08-01 DIAGNOSIS — M546 Pain in thoracic spine: Secondary | ICD-10-CM | POA: Diagnosis not present

## 2019-08-29 DIAGNOSIS — M545 Low back pain: Secondary | ICD-10-CM | POA: Diagnosis not present

## 2019-09-26 DIAGNOSIS — M545 Low back pain: Secondary | ICD-10-CM | POA: Diagnosis not present

## 2019-09-26 DIAGNOSIS — M9903 Segmental and somatic dysfunction of lumbar region: Secondary | ICD-10-CM | POA: Diagnosis not present

## 2019-10-10 DIAGNOSIS — M545 Low back pain: Secondary | ICD-10-CM | POA: Diagnosis not present

## 2019-10-10 DIAGNOSIS — R519 Headache, unspecified: Secondary | ICD-10-CM | POA: Diagnosis not present

## 2019-10-24 DIAGNOSIS — M545 Low back pain: Secondary | ICD-10-CM | POA: Diagnosis not present

## 2019-10-24 DIAGNOSIS — M9903 Segmental and somatic dysfunction of lumbar region: Secondary | ICD-10-CM | POA: Diagnosis not present

## 2019-11-07 DIAGNOSIS — M545 Low back pain: Secondary | ICD-10-CM | POA: Diagnosis not present

## 2019-11-07 DIAGNOSIS — M9903 Segmental and somatic dysfunction of lumbar region: Secondary | ICD-10-CM | POA: Diagnosis not present

## 2019-11-21 DIAGNOSIS — M545 Low back pain: Secondary | ICD-10-CM | POA: Diagnosis not present

## 2019-11-21 DIAGNOSIS — M9903 Segmental and somatic dysfunction of lumbar region: Secondary | ICD-10-CM | POA: Diagnosis not present

## 2019-12-10 DIAGNOSIS — M542 Cervicalgia: Secondary | ICD-10-CM | POA: Diagnosis not present

## 2019-12-10 DIAGNOSIS — M9903 Segmental and somatic dysfunction of lumbar region: Secondary | ICD-10-CM | POA: Diagnosis not present

## 2019-12-10 DIAGNOSIS — M545 Low back pain: Secondary | ICD-10-CM | POA: Diagnosis not present

## 2020-01-07 DIAGNOSIS — M545 Low back pain: Secondary | ICD-10-CM | POA: Diagnosis not present

## 2020-01-07 DIAGNOSIS — M9903 Segmental and somatic dysfunction of lumbar region: Secondary | ICD-10-CM | POA: Diagnosis not present

## 2020-01-07 DIAGNOSIS — M542 Cervicalgia: Secondary | ICD-10-CM | POA: Diagnosis not present

## 2020-01-11 ENCOUNTER — Encounter (HOSPITAL_COMMUNITY): Payer: Self-pay | Admitting: Obstetrics and Gynecology

## 2020-01-11 ENCOUNTER — Other Ambulatory Visit: Payer: Self-pay

## 2020-01-11 ENCOUNTER — Inpatient Hospital Stay (HOSPITAL_COMMUNITY): Payer: BC Managed Care – PPO

## 2020-01-11 ENCOUNTER — Inpatient Hospital Stay (HOSPITAL_COMMUNITY)
Admission: AD | Admit: 2020-01-11 | Discharge: 2020-01-11 | Disposition: A | Discharge status: Home / Self Care | Payer: BC Managed Care – PPO | Attending: Obstetrics and Gynecology | Admitting: Obstetrics and Gynecology

## 2020-01-11 DIAGNOSIS — Z3A00 Weeks of gestation of pregnancy not specified: Secondary | ICD-10-CM | POA: Diagnosis not present

## 2020-01-11 DIAGNOSIS — Z3A09 9 weeks gestation of pregnancy: Secondary | ICD-10-CM | POA: Diagnosis not present

## 2020-01-11 DIAGNOSIS — O021 Missed abortion: Secondary | ICD-10-CM | POA: Diagnosis not present

## 2020-01-11 DIAGNOSIS — O034 Incomplete spontaneous abortion without complication: Secondary | ICD-10-CM | POA: Diagnosis not present

## 2020-01-11 DIAGNOSIS — O4690 Antepartum hemorrhage, unspecified, unspecified trimester: Secondary | ICD-10-CM

## 2020-01-11 DIAGNOSIS — O209 Hemorrhage in early pregnancy, unspecified: Secondary | ICD-10-CM | POA: Diagnosis not present

## 2020-01-11 DIAGNOSIS — R109 Unspecified abdominal pain: Secondary | ICD-10-CM | POA: Diagnosis not present

## 2020-01-11 HISTORY — DX: Polyp of corpus uteri: N84.0

## 2020-01-11 LAB — CBC
HCT: 40.5 % (ref 36.0–46.0)
Hemoglobin: 13.5 g/dL (ref 12.0–15.0)
MCH: 30.3 pg (ref 26.0–34.0)
MCHC: 33.3 g/dL (ref 30.0–36.0)
MCV: 90.8 fL (ref 80.0–100.0)
Platelets: 235 10*3/uL (ref 150–400)
RBC: 4.46 MIL/uL (ref 3.87–5.11)
RDW: 12 % (ref 11.5–15.5)
WBC: 7.9 10*3/uL (ref 4.0–10.5)
nRBC: 0 % (ref 0.0–0.2)

## 2020-01-11 LAB — WET PREP, GENITAL
Clue Cells Wet Prep HPF POC: NONE SEEN
Sperm: NONE SEEN
Trich, Wet Prep: NONE SEEN
Yeast Wet Prep HPF POC: NONE SEEN

## 2020-01-11 LAB — URINALYSIS, ROUTINE W REFLEX MICROSCOPIC
Bilirubin Urine: NEGATIVE
Glucose, UA: NEGATIVE mg/dL
Ketones, ur: NEGATIVE mg/dL
Nitrite: NEGATIVE
Protein, ur: NEGATIVE mg/dL
RBC / HPF: >50 RBC/hpf — ABNORMAL HIGH (ref 0–5)
Specific Gravity, Urine: 1.006 (ref 1.005–1.030)
pH: 9 — ABNORMAL HIGH (ref 5.0–8.0)

## 2020-01-11 LAB — ABO/RH: ABO/RH(D): B POS

## 2020-01-11 LAB — HCG, QUANTITATIVE, PREGNANCY: hCG, Beta Chain, Quant, S: 11272 m[IU]/mL — ABNORMAL HIGH (ref <5)

## 2020-01-11 LAB — POCT PREGNANCY, URINE: Preg Test, Ur: POSITIVE — AB

## 2020-01-11 MED ORDER — ACETAMINOPHEN 500 MG PO TABS
1000.0000 mg | ORAL_TABLET | Freq: Once | ORAL | Status: AC
  Administered 2020-01-11: 1000 mg via ORAL
  Filled 2020-01-11: qty 2

## 2020-01-11 NOTE — MAU Note (Signed)
. .  Michelle Sutton is a 34 y.o. at in MAU reporting: she is 9.[redacted] week pregnant and states yesterday she had pain that is worse than normal.  Pt state todays she was napping and had increased pai nthat woke her up.  Pt states when she peed she had chunks of blood in the toilet pt states the biggest clot was quarter sized.  LMP: February 25th 2021 Pain score: 6/10 Vitals:   01/11/20 1941  BP: 129/89  Pulse: 88  Resp: 17  Temp: 98.7 F (37.1 C)      Lab orders placed from triage:  Urine and POCT.

## 2020-01-11 NOTE — MAU Provider Note (Signed)
History     CSN: [id number]  Arrival date and time: 01/11/20 5176   First Provider Initiated Contact with Patient 01/11/20 2047      Chief Complaint  Patient presents with  . Abdominal Pain  . Vaginal Bleeding   Michelle Sutton is a 34 y.o. [id number] at [redacted]w[redacted]d by Definite LMP of Nov 07, 2019 who receives care at [facility].  She presents today for Abdominal Pain and Vaginal Bleeding.  She reports she was having some spotting and pain yesterday that resolved with rest.  She states she was napping today and woke up because of the pain and went to use the restroom and saw blood.  Patient states the pain is located "right at the bottom" and points to her lower abdomen and upper pelvic area.  Patient describes the pain as "like someone is tying something" and endorses that it is constant.  Patient rates the pain a 6-7/10 and has not tried any OTC medications.  She reports the pain is improved with laying down, but does not make it go away.  She reports the pain is worsened with sitting.      OB History    Gravida  4   Para  1   Term  1   Preterm      AB  2   Living  1     SAB      TAB      Ectopic      Multiple      Live Births  1           Past Medical History:  Diagnosis Date  . Migraines   . Uterine polyp     Past Surgical History:  Procedure Laterality Date  . CESAREAN SECTION    . DILATATION & CURETTAGE/HYSTEROSCOPY WITH MYOSURE N/A 05/31/2018   Procedure: DILATATION & CURETTAGE/HYSTEROSCOPY WITH MYOSURE;  Surgeon: Hoover Browns, MD;  Location: [facility];  Service: Gynecology;  Laterality: N/A;  . WISDOM TOOTH EXTRACTION      History reviewed. No pertinent family history.  Social History   Tobacco Use  . Smoking status: Never Smoker  . Smokeless tobacco: Never Used  Substance Use Topics  . Alcohol use: Yes  . Drug use: Never    Allergies:  Allergies  Allergen Reactions  . Sulfa Antibiotics   . Eggs Or Egg-Derived Products  Rash    Medications Prior to Admission  Medication Sig Dispense Refill Last Dose  . loratadine (CLARITIN) 10 MG tablet Take 10 mg by mouth daily.     . Prenatal Vit-Fe Fumarate-FA (PRENATAL MULTIVITAMIN) TABS tablet Take 1 tablet by mouth daily at 12 noon.     . methocarbamol (ROBAXIN) 500 MG tablet Take 2 tablets (1,000 mg total) by mouth every 8 (eight) hours as needed for muscle spasms. 30 tablet 0     Review of Systems  Constitutional: Negative for chills and fever.  Respiratory: Negative for cough and shortness of breath.   Gastrointestinal: Positive for abdominal pain. Negative for constipation, diarrhea, nausea and vomiting.  Genitourinary: Positive for vaginal bleeding and vaginal discharge (Brownish discharge yesterday). Negative for difficulty urinating and dysuria.  Neurological: Negative for dizziness, light-headedness and headaches.   Physical Exam   Blood pressure 129/89, pulse 88, temperature 98.7 F (37.1 C), temperature source Oral, resp. rate 17, last menstrual period 11/07/2019.  Physical Exam  Constitutional: She is oriented to person, place, and time. She appears well-developed and well-nourished.  HENT:  Head:  Normocephalic and atraumatic.  Eyes: Conjunctivae are normal.  Cardiovascular: Normal rate.  Respiratory: Effort normal.  GI: Soft. There is abdominal tenderness in the suprapubic area.  Genitourinary:    Vaginal bleeding present.  There is bleeding in the vagina.    Genitourinary Comments: Speculum Exam: -Normal External Genitalia: Non tender, Small amt blood noted at introitus.  However, upon insertion of speculum blood flows rapidly from vagina. -Vaginal Vault: Pink mucosa with good rugae. Abundant amt of blood and clots removed with ring forceps and gauze. Questionable gestational sac removed and send for pathology-wet prep collected -Cervix:Pink, no lesions, cysts, or polyps.  Appears open. Small amt ctive bleeding with some POC from os-GC/CT  collected -Bimanual Exam:  Deferred   Musculoskeletal:        General: Normal range of motion.     Cervical back: Normal range of motion.  Neurological: She is alert and oriented to person, place, and time.  Skin: Skin is warm and dry.  Psychiatric: She has a normal mood and affect. Her behavior is normal.    MAU Course  Procedures Results for orders placed or performed during the hospital encounter of 01/11/20 (from the past 24 hour(s))  Urinalysis, Routine w reflex microscopic     Status: Abnormal   Collection Time: 01/11/20  7:49 PM  Result Value Ref Range   Color, Urine YELLOW YELLOW   APPearance HAZY (A) CLEAR   Specific Gravity, Urine 1.006 1.005 - 1.030   pH 9.0 (H) 5.0 - 8.0   Glucose, UA NEGATIVE NEGATIVE mg/dL   Hgb urine dipstick LARGE (A) NEGATIVE   Bilirubin Urine NEGATIVE NEGATIVE   Ketones, ur NEGATIVE NEGATIVE mg/dL   Protein, ur NEGATIVE NEGATIVE mg/dL   Nitrite NEGATIVE NEGATIVE   Leukocytes,Ua SMALL (A) NEGATIVE   RBC / HPF >50 (H) 0 - 5 RBC/hpf   WBC, UA 11-20 0 - 5 WBC/hpf   Bacteria, UA FEW (A) NONE SEEN   Squamous Epithelial / LPF 0-5 0 - 5  Pregnancy, urine POC     Status: Abnormal   Collection Time: 01/11/20  7:49 PM  Result Value Ref Range   Preg Test, Ur POSITIVE (A) NEGATIVE  Wet prep, genital     Status: Abnormal   Collection Time: 01/11/20  9:09 PM   Specimen: PATH Cytology Cervicovaginal Ancillary Only  Result Value Ref Range   Yeast Wet Prep HPF POC NONE SEEN NONE SEEN   Trich, Wet Prep NONE SEEN NONE SEEN   Clue Cells Wet Prep HPF POC NONE SEEN NONE SEEN   WBC, Wet Prep HPF POC MANY (A) NONE SEEN   Sperm NONE SEEN   ABO/Rh     Status: None   Collection Time: 01/11/20  9:35 PM  Result Value Ref Range   ABO/RH(D) B POS    No rh immune globuloin      NOT A RH IMMUNE GLOBULIN CANDIDATE, PT RH POSITIVE Performed at [facility], [address] [place], [place] [address]   CBC     Status: None   Collection Time: 01/11/20  9:35 PM   Result Value Ref Range   WBC 7.9 4.0 - 10.5 K/uL   RBC 4.46 3.87 - 5.11 MIL/uL   Hemoglobin 13.5 12.0 - 15.0 g/dL   HCT 89.3 81.0 - 17.5 %   MCV 90.8 80.0 - 100.0 fL   MCH 30.3 26.0 - 34.0 pg   MCHC 33.3 30.0 - 36.0 g/dL   RDW 10.2 58.5 - 27.7 %  Platelets 235 150 - 400 K/uL   nRBC 0.0 0.0 - 0.2 %  hCG, quantitative, pregnancy     Status: Abnormal   Collection Time: 01/11/20  9:35 PM  Result Value Ref Range   hCG, Beta Chain, Quant, S 11,272 (H) <5 mIU/mL   US OB LESS THAN 14 WEEKS WITH OB TRANSVAGINAL  Result Date: 01/11/2020 CLINICAL DATA:  Bleeding, passing clots EXAM: OBSTETRIC <14 WK [place] AND TRANSVAGINAL OB US TECHNIQUE: Both transabdominal and transvaginal ultrasound examinations were performed for complete evaluation of the gestation as well as the maternal uterus, adnexal regions, and pelvic cul-de-sac. Transvaginal technique was performed to assess early pregnancy. COMPARISON:  None. FINDINGS: Intrauterine gestational sac: None Yolk sac:  Not Visualized. Embryo:  Not Visualized. Cardiac Activity: Not Visualized. Maternal uterus/adnexae: Diffuse endometrial thickening up to 16 mm. Trace fluid is noted within the endometrial canal but without focal collection to suggest the presence of an intrauterine gestational sac. Some endometrial hypervascularity is present on color Doppler imaging. IMPRESSION: No intrauterine pregnancy visualized with thickened hypervascular endometrium and small amount fluid in the endometrial canal. Primary differential consideration would be spontaneous abortion with endometrial thickening reflecting endometrial blood products and/or retained products of conception. Alternatively a pregnancy of unknown location or early gestation are not fully excluded though felt less likely. Recommend short-term interval follow-up ultrasound and serial quantitative beta hCG trending. Electronically Signed   By: Lovena Le M.D.   On: 01/11/2020 22:53    MDM Pelvic Exam; Wet  Prep and GC/CT Labs: UA, UPT, UC, CBC, hCG, ABO Ultrasound Assessment and Plan  34 year old [id number] at 9.2 weeks Incomplete Abortion  -POC reviewed -Patient offered and declines pain medication.  -Exam performed and findings discussed. -Cultures collected and pending.  -Informed that findings are highly likely for miscarriage and patient requests to see and shown suspected POC. -Informed that labs would still be drawn and [place] will be performed.  -Patient tearful and support given by provider and nurse. Condolences given  -Labs ordered -Will send for [place] and await results.  Maryann Conners 01/11/2020, 8:47 PM   Reassessment (10:28 PM) -Per nurse, patient reports continued pain and discomfort. -Will give tylenol 1gram and reassess -Await results.   Reassessment (11:20 PM) Quant 11, 272 No IUP  SAB  -Results reviewed with patient. -Questions addressed regarding causes for miscarriage. -Encouraged to discuss with primary ob about timing for conception. -Reviewed need for follow up for repeat hCG as deemed appropriate with primary ob. Katina Degree, CNM/FNP contacted and informed of patient status and need for follow up. -Patient reports improvement of cramping with tylenol dosing.  -Bleeding Precautions Given. -Encouraged to call or return to MAU if symptoms worsen or with the onset of new symptoms. -Discharged to home in stable condition.  Maryann Conners MSN, CNM Advanced Practice Provider, [facility]

## 2020-01-11 NOTE — Discharge Instructions (Signed)

## 2020-01-13 LAB — CULTURE, OB URINE: Culture: 60000 — AB

## 2020-01-13 LAB — GC/CHLAMYDIA PROBE AMP (~~LOC~~) NOT AT ARMC
Chlamydia: NEGATIVE
Comment: NEGATIVE
Comment: NORMAL
Neisseria Gonorrhea: NEGATIVE

## 2020-01-14 LAB — SURGICAL PATHOLOGY

## 2020-01-15 DIAGNOSIS — O034 Incomplete spontaneous abortion without complication: Secondary | ICD-10-CM | POA: Diagnosis not present

## 2020-01-22 DIAGNOSIS — O034 Incomplete spontaneous abortion without complication: Secondary | ICD-10-CM | POA: Diagnosis not present

## 2020-01-22 DIAGNOSIS — Z579 Occupational exposure to unspecified risk factor: Secondary | ICD-10-CM | POA: Diagnosis not present

## 2020-01-29 DIAGNOSIS — O034 Incomplete spontaneous abortion without complication: Secondary | ICD-10-CM | POA: Diagnosis not present

## 2020-02-05 DIAGNOSIS — O034 Incomplete spontaneous abortion without complication: Secondary | ICD-10-CM | POA: Diagnosis not present

## 2020-02-21 DIAGNOSIS — Z01419 Encounter for gynecological examination (general) (routine) without abnormal findings: Secondary | ICD-10-CM | POA: Diagnosis not present

## 2020-02-21 DIAGNOSIS — Z124 Encounter for screening for malignant neoplasm of cervix: Secondary | ICD-10-CM | POA: Diagnosis not present

## 2020-02-21 DIAGNOSIS — Z6823 Body mass index (BMI) 23.0-23.9, adult: Secondary | ICD-10-CM | POA: Diagnosis not present

## 2020-03-09 DIAGNOSIS — M9905 Segmental and somatic dysfunction of pelvic region: Secondary | ICD-10-CM | POA: Diagnosis not present

## 2020-03-09 DIAGNOSIS — M9904 Segmental and somatic dysfunction of sacral region: Secondary | ICD-10-CM | POA: Diagnosis not present

## 2020-03-09 DIAGNOSIS — M9903 Segmental and somatic dysfunction of lumbar region: Secondary | ICD-10-CM | POA: Diagnosis not present

## 2020-03-09 DIAGNOSIS — M545 Low back pain: Secondary | ICD-10-CM | POA: Diagnosis not present

## 2020-03-10 DIAGNOSIS — M9904 Segmental and somatic dysfunction of sacral region: Secondary | ICD-10-CM | POA: Diagnosis not present

## 2020-03-10 DIAGNOSIS — M9903 Segmental and somatic dysfunction of lumbar region: Secondary | ICD-10-CM | POA: Diagnosis not present

## 2020-03-10 DIAGNOSIS — M545 Low back pain: Secondary | ICD-10-CM | POA: Diagnosis not present

## 2020-03-10 DIAGNOSIS — M9905 Segmental and somatic dysfunction of pelvic region: Secondary | ICD-10-CM | POA: Diagnosis not present

## 2020-03-11 DIAGNOSIS — M9905 Segmental and somatic dysfunction of pelvic region: Secondary | ICD-10-CM | POA: Diagnosis not present

## 2020-03-11 DIAGNOSIS — M545 Low back pain: Secondary | ICD-10-CM | POA: Diagnosis not present

## 2020-03-11 DIAGNOSIS — M9904 Segmental and somatic dysfunction of sacral region: Secondary | ICD-10-CM | POA: Diagnosis not present

## 2020-03-11 DIAGNOSIS — M9903 Segmental and somatic dysfunction of lumbar region: Secondary | ICD-10-CM | POA: Diagnosis not present

## 2020-03-12 DIAGNOSIS — M9904 Segmental and somatic dysfunction of sacral region: Secondary | ICD-10-CM | POA: Diagnosis not present

## 2020-03-12 DIAGNOSIS — M545 Low back pain: Secondary | ICD-10-CM | POA: Diagnosis not present

## 2020-03-12 DIAGNOSIS — M9905 Segmental and somatic dysfunction of pelvic region: Secondary | ICD-10-CM | POA: Diagnosis not present

## 2020-03-12 DIAGNOSIS — M9903 Segmental and somatic dysfunction of lumbar region: Secondary | ICD-10-CM | POA: Diagnosis not present

## 2020-03-13 DIAGNOSIS — M545 Low back pain: Secondary | ICD-10-CM | POA: Diagnosis not present

## 2020-03-13 DIAGNOSIS — M9904 Segmental and somatic dysfunction of sacral region: Secondary | ICD-10-CM | POA: Diagnosis not present

## 2020-03-13 DIAGNOSIS — M9903 Segmental and somatic dysfunction of lumbar region: Secondary | ICD-10-CM | POA: Diagnosis not present

## 2020-03-13 DIAGNOSIS — M9905 Segmental and somatic dysfunction of pelvic region: Secondary | ICD-10-CM | POA: Diagnosis not present

## 2020-03-17 DIAGNOSIS — M9905 Segmental and somatic dysfunction of pelvic region: Secondary | ICD-10-CM | POA: Diagnosis not present

## 2020-03-17 DIAGNOSIS — M9903 Segmental and somatic dysfunction of lumbar region: Secondary | ICD-10-CM | POA: Diagnosis not present

## 2020-03-17 DIAGNOSIS — M9904 Segmental and somatic dysfunction of sacral region: Secondary | ICD-10-CM | POA: Diagnosis not present

## 2020-03-17 DIAGNOSIS — M545 Low back pain: Secondary | ICD-10-CM | POA: Diagnosis not present

## 2020-03-20 DIAGNOSIS — M545 Low back pain: Secondary | ICD-10-CM | POA: Diagnosis not present

## 2020-03-20 DIAGNOSIS — M9903 Segmental and somatic dysfunction of lumbar region: Secondary | ICD-10-CM | POA: Diagnosis not present

## 2020-03-20 DIAGNOSIS — M9905 Segmental and somatic dysfunction of pelvic region: Secondary | ICD-10-CM | POA: Diagnosis not present

## 2020-03-20 DIAGNOSIS — M9904 Segmental and somatic dysfunction of sacral region: Secondary | ICD-10-CM | POA: Diagnosis not present

## 2020-05-22 IMAGING — US US OB < 14 WEEKS - US OB TV
1 series · 15 of 28 positions shown · non-contrast
Comparison: None.

CLINICAL DATA: Bleeding, passing clots

EXAM:
OBSTETRIC <14 WK US AND TRANSVAGINAL OB US
TECHNIQUE: Both transabdominal and transvaginal ultrasound examinations were
performed for complete evaluation of the gestation as well as the
maternal uterus, adnexal regions, and pelvic cul-de-sac.
Transvaginal technique was performed to assess early pregnancy.

[Series 1: us ob < 14 weeks - us ob tv · 55 acquisitions, 15 frames shown]
[im 1/55]
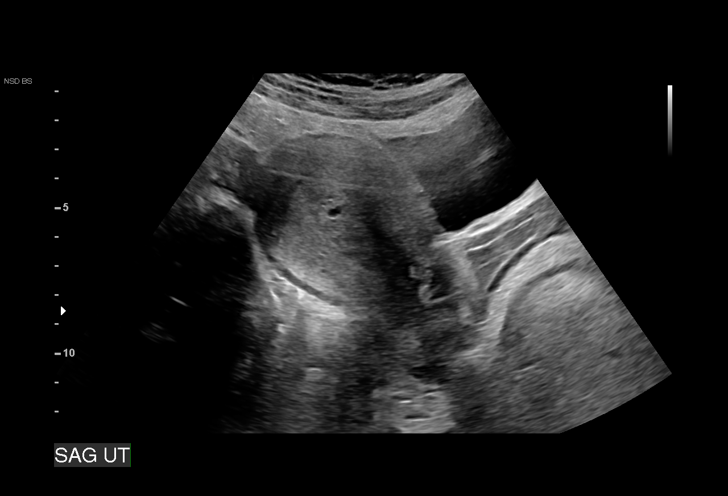
[im 5/55]
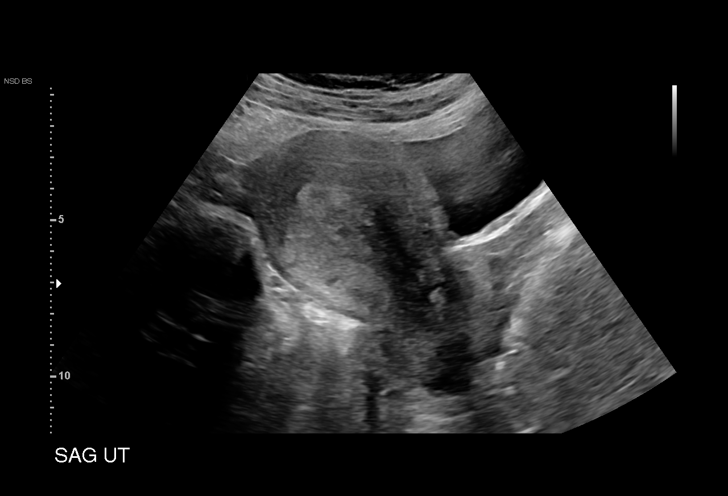
[im 9/55]
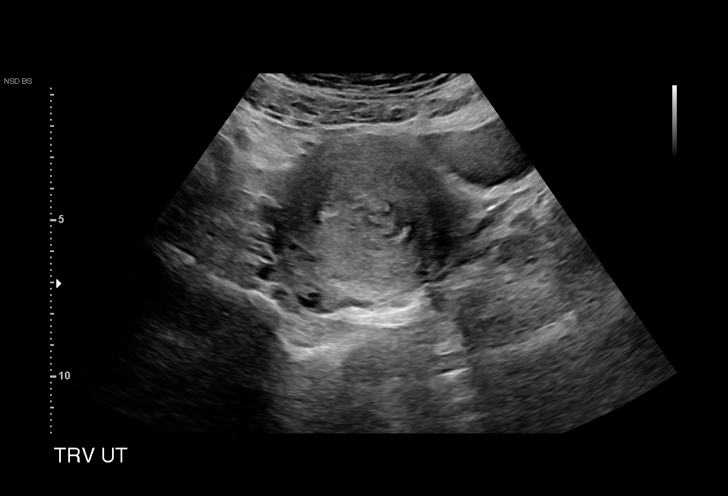
[im 13/55]
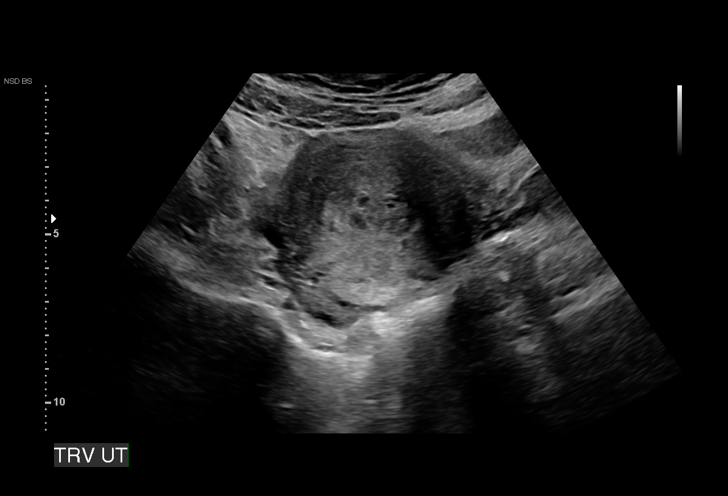
[im 17/55]
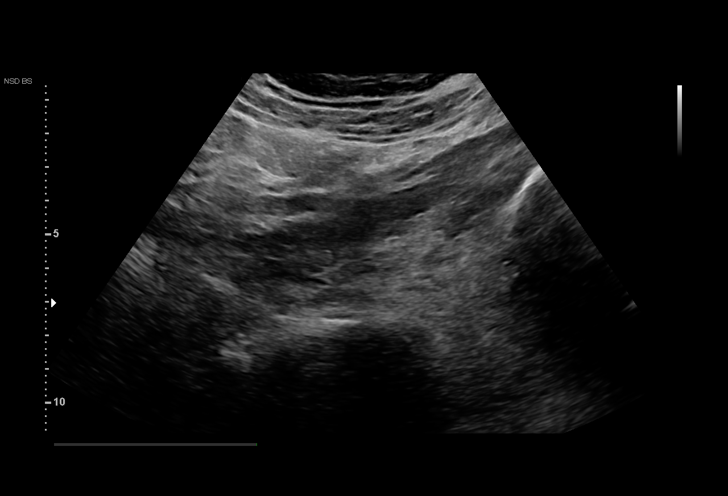
[im 21/55]
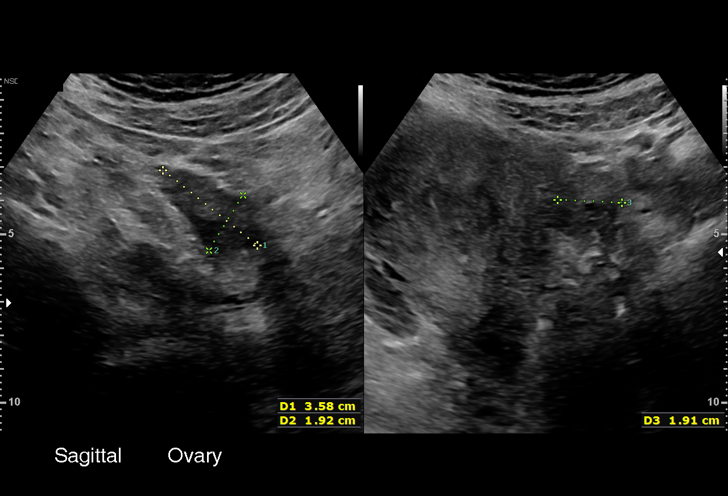
[im 25/55]
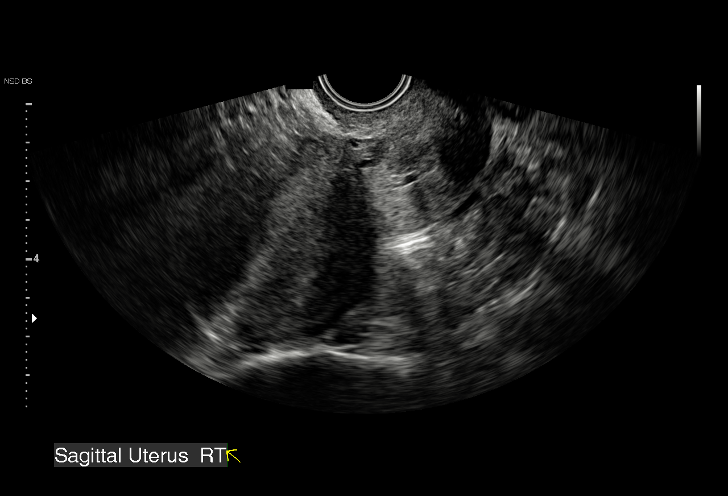
[im 29/55]
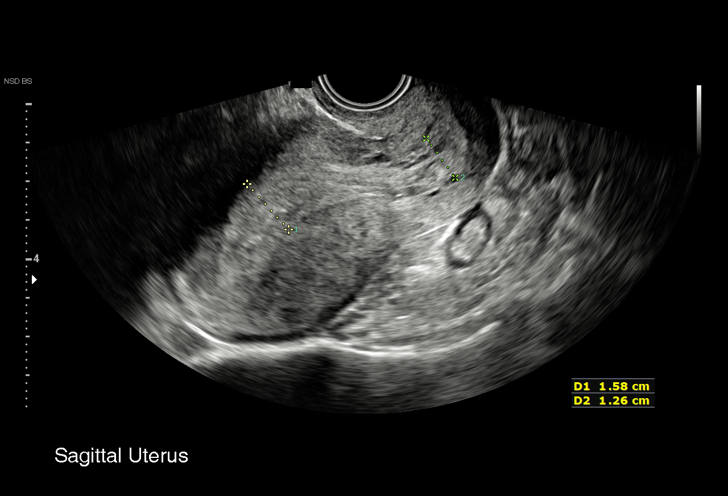
[im 31/55]
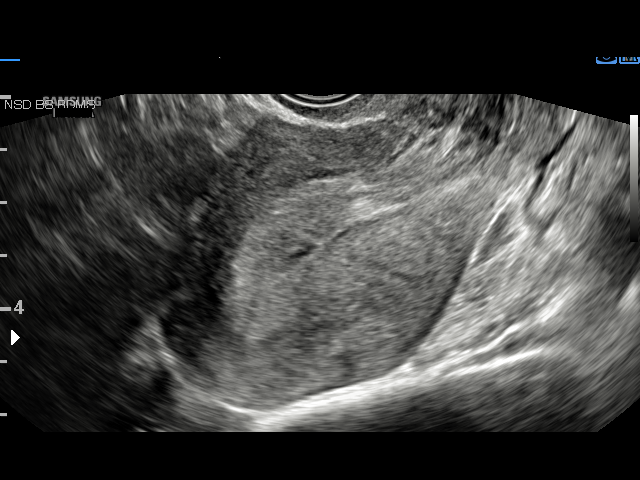
[im 35/55]
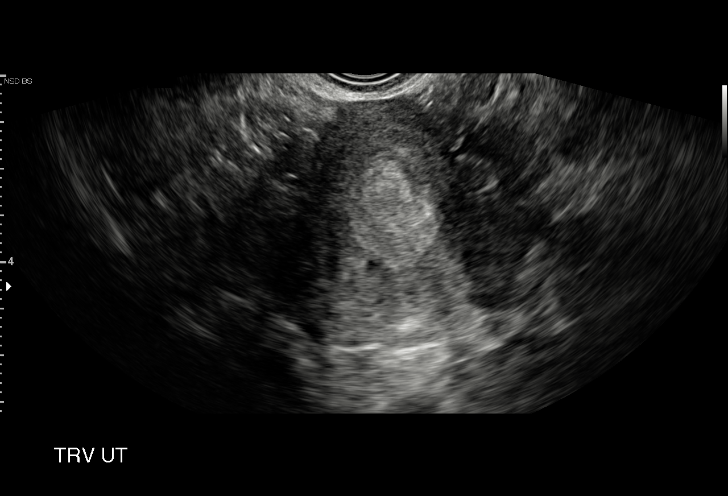
[im 39/55]
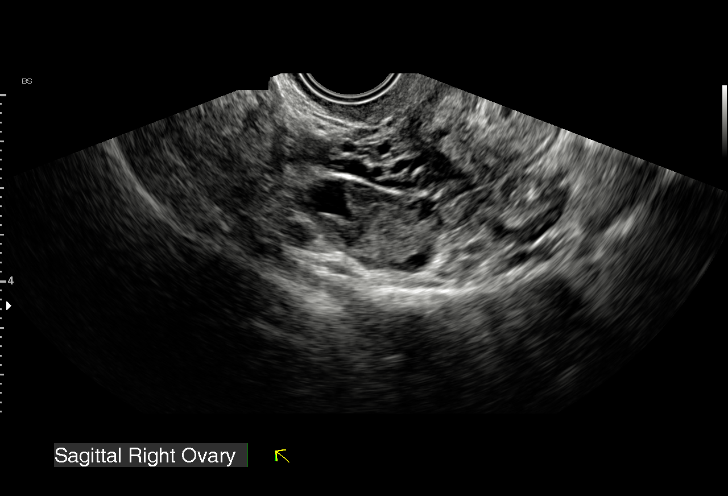
[im 43/55]
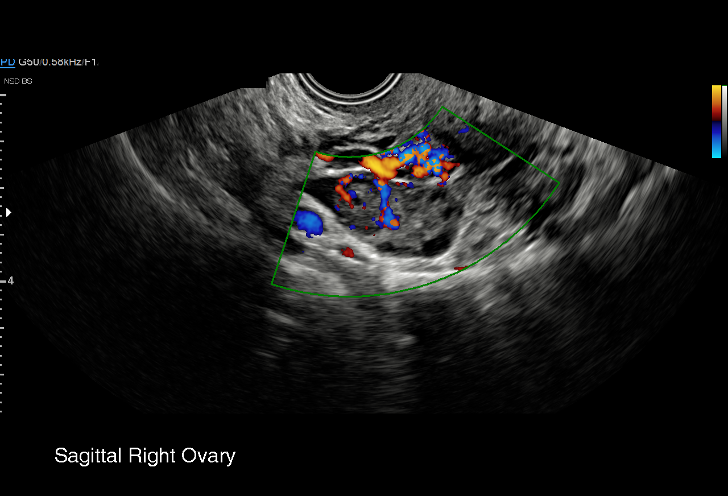
[im 47/55]
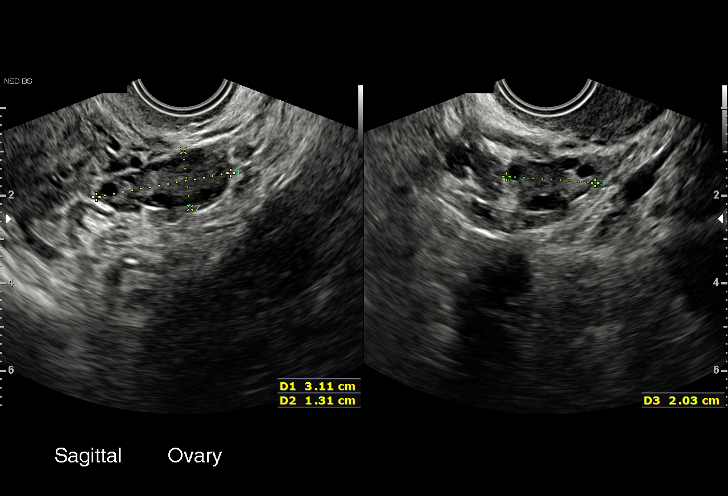
[im 51/55]
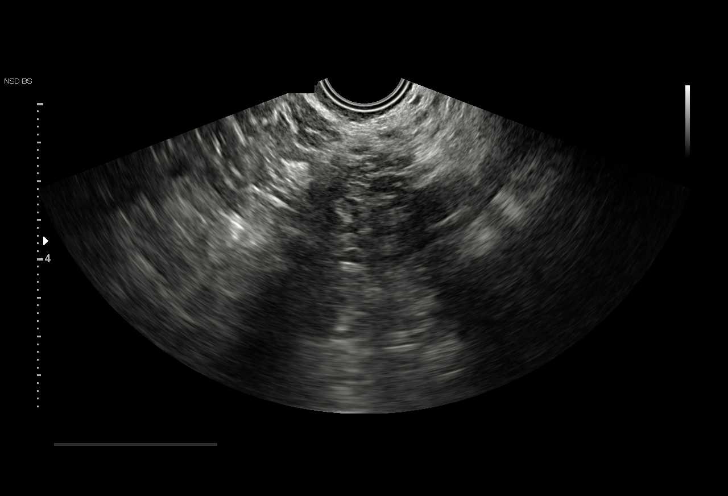
[im 55/55]
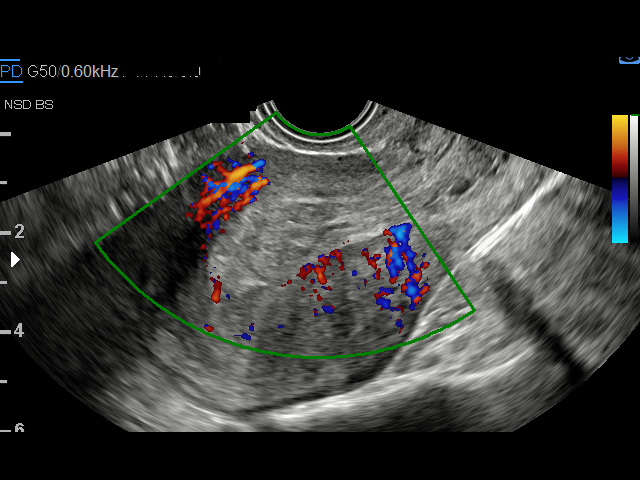

[15 of 28 positions shown; findings below may reference images not displayed]

FINDINGS: Intrauterine gestational sac: None

Yolk sac:  Not Visualized.

Embryo:  Not Visualized.

Cardiac Activity: Not Visualized.

Maternal uterus/adnexae: Diffuse endometrial thickening up to 16 mm.
Trace fluid is noted within the endometrial canal but without focal
collection to suggest the presence of an intrauterine gestational
sac. Some endometrial hypervascularity is present on color Doppler
imaging.
IMPRESSION: No intrauterine pregnancy visualized with thickened hypervascular
endometrium and small amount fluid in the endometrial canal. Primary
differential consideration would be spontaneous abortion with
endometrial thickening reflecting endometrial blood products and/or
retained products of conception. Alternatively a pregnancy of
unknown location or early gestation are not fully excluded though
felt less likely. Recommend short-term interval follow-up ultrasound
and serial quantitative beta hCG trending.
